# Patient Record
Sex: Female | Born: 1995 | Race: Black or African American | Hispanic: No | Marital: Single | Smoking: Never smoker
Health system: Southern US, Community
[De-identification: ages and names within clinical notes are randomized; demographics above are authoritative.]

## PROBLEM LIST (undated history)

## (undated) DIAGNOSIS — J45909 Unspecified asthma, uncomplicated: Secondary | ICD-10-CM

## (undated) HISTORY — PX: RETAINED PLACENTA REMOVAL: SHX2336

---
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
## 2019-03-26 ENCOUNTER — Encounter (HOSPITAL_BASED_OUTPATIENT_CLINIC_OR_DEPARTMENT_OTHER): Payer: Self-pay

## 2019-03-26 ENCOUNTER — Emergency Department (HOSPITAL_BASED_OUTPATIENT_CLINIC_OR_DEPARTMENT_OTHER): Payer: Medicaid Other

## 2019-03-26 ENCOUNTER — Other Ambulatory Visit: Payer: Self-pay

## 2019-03-26 ENCOUNTER — Emergency Department (HOSPITAL_BASED_OUTPATIENT_CLINIC_OR_DEPARTMENT_OTHER)
Admission: EM | Admit: 2019-03-26 | Discharge: 2019-03-26 | Disposition: A | Discharge status: Home / Self Care | Payer: Medicaid Other | Attending: Emergency Medicine | Admitting: Emergency Medicine

## 2019-03-26 DIAGNOSIS — R112 Nausea with vomiting, unspecified: Secondary | ICD-10-CM | POA: Diagnosis present

## 2019-03-26 DIAGNOSIS — Z3201 Encounter for pregnancy test, result positive: Secondary | ICD-10-CM | POA: Diagnosis not present

## 2019-03-26 DIAGNOSIS — R1030 Lower abdominal pain, unspecified: Secondary | ICD-10-CM | POA: Diagnosis not present

## 2019-03-26 DIAGNOSIS — J45909 Unspecified asthma, uncomplicated: Secondary | ICD-10-CM | POA: Diagnosis not present

## 2019-03-26 LAB — URINALYSIS, ROUTINE W REFLEX MICROSCOPIC
Bilirubin Urine: NEGATIVE
Bilirubin Urine: NEGATIVE
Glucose, UA: NEGATIVE mg/dL
Glucose, UA: NEGATIVE mg/dL
Hgb urine dipstick: NEGATIVE
Hgb urine dipstick: NEGATIVE
Ketones, ur: NEGATIVE mg/dL
Ketones, ur: 15 mg/dL — AB
Leukocytes,Ua: NEGATIVE
Nitrite: NEGATIVE
Nitrite: NEGATIVE
Protein, ur: NEGATIVE mg/dL
Protein, ur: NEGATIVE mg/dL
Specific Gravity, Urine: 1.02 (ref 1.005–1.030)
Specific Gravity, Urine: 1.02 (ref 1.005–1.030)
pH: 8 (ref 5.0–8.0)
pH: 7.5 (ref 5.0–8.0)

## 2019-03-26 LAB — URINALYSIS, MICROSCOPIC (REFLEX)

## 2019-03-26 LAB — HCG, QUANTITATIVE, PREGNANCY: hCG, Beta Chain, Quant, S: 401 m[IU]/mL — ABNORMAL HIGH (ref <5)

## 2019-03-26 LAB — PREGNANCY, URINE: Preg Test, Ur: POSITIVE — AB

## 2019-03-26 MED ORDER — PRENATAL COMPLETE 14-0.4 MG PO TABS: 1.0000 | ORAL_TABLET | Freq: Every day | ORAL | 0 refills | Status: DC

## 2019-03-26 NOTE — ED Provider Notes (Signed)
[facility] EMERGENCY DEPARTMENT Provider Note   CSN: [id number] Arrival date & time: 03/26/19  1251    History   Chief Complaint Chief Complaint  Patient presents with   Emesis    HPI Jamie Price is a 23 y.o. female.     The history is provided by the patient and medical records. No language interpreter was used.  Emesis Associated symptoms: abdominal pain   Associated symptoms: no diarrhea    Jamie Price is a 23 y.o. female who presents to the Emergency Department complaining of nausea and vomiting for the last 2 to 3 days.  Patient reports nausea with a couple episodes of emesis mostly in the morning.  As the day goes on, she starts to feel improved.  She did have 3 episodes of emesis today, but since then has been able to eat lunch well and tolerate this fine.  She currently is not having any abdominal pain, but has had intermittent lower abdominal cramping off and on for the last 2 to 3 days.  She denies any vaginal bleeding.  No vaginal discharge.  No urinary urgency, frequency or dysuria.  Her last menstrual cycle was May 9.  She did not take a pregnancy test at home, but does report expecting pregnancy and came to the emergency department hoping to get a pregnancy test.  She does report having a second trimester miscarriage in 2018.  She states that she did not have any symptoms at that time and was told about the fetal demise at a routine appointment.  Subsequently went under induction / D&C.  She states that because of this complication in the past, she just wanted to come to the hospital to make sure everything was okay if she was pregnant.  Past Medical History:  Diagnosis Date   Asthma     There are no active problems to display for this patient.   Past Surgical History:  Procedure Laterality Date   RETAINED PLACENTA REMOVAL       OB History   No obstetric history on file.      Home Medications    Prior to Admission medications     Medication Sig Start Date End Date Taking? Authorizing Provider  hydrocortisone cream 1 % Apply to affected area 2 times daily 04/25/13   Renne CriglerGeiple, Joshua, PA-C  Prenatal Vit-Fe Fumarate-FA (PRENATAL COMPLETE) 14-0.4 MG TABS Take 1 tablet by mouth daily. 03/26/19   Jacqueline Delapena, Chase PicketJaime Pilcher, PA-C    Family History No family history on file.  Social History Social History   Tobacco Use   Smoking status: Never Smoker   Smokeless tobacco: Never Used  Substance Use Topics   Alcohol use: No   Drug use: No     Allergies   Patient has no known allergies.   Review of Systems Review of Systems  Gastrointestinal: Positive for abdominal pain, nausea and vomiting. Negative for constipation and diarrhea.  Genitourinary: Negative for dysuria.  All other systems reviewed and are negative.    Physical Exam Updated Vital Signs BP 122/87 (BP Location: Right Arm)    Pulse 96    Temp 98.3 F (36.8 C) (Oral)    Resp 14    Ht 5\' 1"  (1.549 m)    Wt (!) 158.6 kg    LMP 02/14/2019    SpO2 100%    BMI 66.07 kg/m   Physical Exam Vitals signs and nursing note reviewed.  Constitutional:      General: She is not in  acute distress.    Appearance: She is well-developed.  HENT:     Head: Normocephalic and atraumatic.  Neck:     Musculoskeletal: Neck supple.  Cardiovascular:     Rate and Rhythm: Normal rate and regular rhythm.     Heart sounds: Normal heart sounds. No murmur.  Pulmonary:     Effort: Pulmonary effort is normal. No respiratory distress.     Breath sounds: Normal breath sounds.  Abdominal:     General: There is no distension.     Palpations: Abdomen is soft.     Comments: No abdominal, flank or CVA tenderness.  Skin:    General: Skin is warm and dry.  Neurological:     Mental Status: She is alert and oriented to person, place, and time.      ED Treatments / Results  Labs (all labs ordered are listed, but only abnormal results are displayed) Labs Reviewed  URINALYSIS,  ROUTINE W REFLEX MICROSCOPIC - Abnormal; Notable for the following components:      Result Value   APPearance CLOUDY (*)    Leukocytes,Ua TRACE (*)    All other components within normal limits  PREGNANCY, URINE - Abnormal; Notable for the following components:   Preg Test, Ur POSITIVE (*)    All other components within normal limits  URINALYSIS, MICROSCOPIC (REFLEX) - Abnormal; Notable for the following components:   Bacteria, UA MANY (*)    All other components within normal limits  HCG, QUANTITATIVE, PREGNANCY - Abnormal; Notable for the following components:   hCG, Beta Chain, Quant, S 401 (*)    All other components within normal limits  URINALYSIS, ROUTINE W REFLEX MICROSCOPIC - Abnormal; Notable for the following components:   Ketones, ur 15 (*)    All other components within normal limits    EKG None  Radiology [place] Ob Comp < 14 Wks  Result Date: 03/26/2019 CLINICAL DATA:  Pregnant patient with abdominal cramping. EXAM: OBSTETRIC <14 WK US AND TRANSVAGINAL OB US TECHNIQUE: Both transabdominal and transvaginal ultrasound examinations were performed for complete evaluation of the gestation as well as the maternal uterus, adnexal regions, and pelvic cul-de-sac. Transvaginal technique was performed to assess early pregnancy. COMPARISON:  None. FINDINGS: Intrauterine gestational sac: Single Yolk sac:  Not Visualized. Embryo:  Not Visualized. Cardiac Activity: Not Visualized. MSD: 2.6 mm   5 w   0 d Subchorionic hemorrhage:  None visualized. Maternal uterus/adnexae: Normal right and left ovaries. Trace fluid in the pelvis. IMPRESSION: Probable early intrauterine gestational sac, but no yolk sac, fetal pole, or cardiac activity yet visualized. Recommend follow-up quantitative B-HCG levels and follow-up US in 14 days to assess viability. This recommendation follows SRU consensus guidelines: Diagnostic Criteria for Nonviable Pregnancy Early in the First Trimester. Malva Limes Engl J Med 2013; 295:6213-08; 369:1443-51.  Electronically Signed   By: Annia Beltrew  Davis M.D.   On: 03/26/2019 16:06   [place] Ob Transvaginal  Result Date: 03/26/2019 CLINICAL DATA:  Pregnant patient with abdominal cramping. EXAM: OBSTETRIC <14 WK US AND TRANSVAGINAL OB US TECHNIQUE: Both transabdominal and transvaginal ultrasound examinations were performed for complete evaluation of the gestation as well as the maternal uterus, adnexal regions, and pelvic cul-de-sac. Transvaginal technique was performed to assess early pregnancy. COMPARISON:  None. FINDINGS: Intrauterine gestational sac: Single Yolk sac:  Not Visualized. Embryo:  Not Visualized. Cardiac Activity: Not Visualized. MSD: 2.6 mm   5 w   0 d Subchorionic hemorrhage:  None visualized. Maternal uterus/adnexae: Normal right and left ovaries. Trace fluid  in the pelvis. IMPRESSION: Probable early intrauterine gestational sac, but no yolk sac, fetal pole, or cardiac activity yet visualized. Recommend follow-up quantitative B-HCG levels and follow-up US in 14 days to assess viability. This recommendation follows SRU consensus guidelines: Diagnostic Criteria for Nonviable Pregnancy Early in the First Trimester. Alta Corning Med 2013; 297:9892-11. Electronically Signed   By: Lovey Newcomer M.D.   On: 03/26/2019 16:06    Procedures Procedures (including critical care time)  Medications Ordered in ED Medications - No data to display   Initial Impression / Assessment and Plan / ED Course  I have reviewed the triage vital signs and the nursing notes.  Pertinent labs & imaging results that were available during my care of the patient were reviewed by me and considered in my medical decision making (see chart for details).       Jamie Price is a 23 y.o. female who presents to ED for concerns that she may be pregnant.  She has been having nausea, vomiting and lower abdominal cramping mostly in the morning.  Currently, she denies any symptoms feels well.  On exam, she is well-appearing, afebrile,  hemodynamically stable with no abdominal tenderness.  Appears well-hydrated.  Urine pregnancy test positive.  hCG only 401.  Patient reports history of second trimester loss in 2018 and concern for complications.  Ultrasound to be obtained, however given such low hCG, expect do not see much and will likely need OB follow-up for serial hCG/repeat ultrasound.  First urinalysis looked like a dirty sample, but did have trace leuks and any bacteria.  Discussed clean catch with patient.  Repeat urinalysis showed no signs of infection.  She is not having any urinary symptoms.  No treatment indicated at this time.  Ultrasound shows probable early intrauterine gestational sac, but no yolk sac, fetal pole or cardiac activity yet visualized.  Expected given LMP and hCG value.  Recommendations for follow-up ultrasound/hCG levels by radiology noted.  We will have her follow-up with her OB/GYN.  She will call them in the morning.  She has not had any nausea or vomiting throughout the entirety of ED stay today. Started her on prenatal.  Reasons to return to the emergency department were discussed and all questions were answered.  Final Clinical Impressions(s) / ED Diagnoses   Final diagnoses:  Positive pregnancy test    ED Discharge Orders         Ordered    Prenatal Vit-Fe Fumarate-FA (PRENATAL COMPLETE) 14-0.4 MG TABS  Daily     03/26/19 1653           Catina Nuss, Ozella Almond, PA-C 03/26/19 1714    Quintella Reichert, MD 03/27/19 [phone]

## 2019-03-26 NOTE — Discharge Instructions (Signed)
It was my pleasure taking care of you today!   Please call your OBGYN tomorrow morning to schedule a follow up appointment for recheck. You will likely need repeat blood work and ultrasound in a few days.   Start taking prenatal vitamins daily.   Return to ER for new or worsening symptoms, any additional concerns.

## 2019-03-26 NOTE — ED Triage Notes (Signed)
Pt c/o n/v, abd cramping x 2-3 days-NAD-steady gait

## 2019-05-05 ENCOUNTER — Telehealth: Payer: Self-pay | Admitting: Family Medicine

## 2019-05-05 ENCOUNTER — Other Ambulatory Visit: Payer: Self-pay

## 2019-05-05 ENCOUNTER — Ambulatory Visit: Payer: Medicaid Other | Admitting: *Deleted

## 2019-05-05 ENCOUNTER — Encounter: Payer: Self-pay | Admitting: Family Medicine

## 2019-05-05 DIAGNOSIS — Z3490 Encounter for supervision of normal pregnancy, unspecified, unspecified trimester: Secondary | ICD-10-CM

## 2019-05-05 NOTE — Progress Notes (Signed)
I called  Jamie Price on 05/05/19 at 09:26AM EDT for the purpose of scheduled appointment to complete [facility] intake. She did not answer the phone. I left a message that I would call back in 10 minutes.   0940  Pt was called a second time and again did not answer. Message was left stating that this interview must take place prior to her first office appointment. Her interview appointment will be rescheduled and she will be notified of the date and time. She may call back if she has questions.     Day, Ronnell Freshwater, RN 05/05/2019  9:29 AM

## 2019-05-05 NOTE — Telephone Encounter (Signed)
Attempted to call patient to get her rescheduled for her missed intake appointment. No answer, left voicemail instructing patient to give the office a call back to be rescheduled. Patient instructed that her appointment on 8/12 has been cancelled as well since the intake appointment was not kept. Office number left and no show letter mailed.

## 2019-05-20 ENCOUNTER — Encounter: Payer: Medicaid Other | Admitting: Obstetrics & Gynecology

## 2019-06-01 ENCOUNTER — Encounter (HOSPITAL_BASED_OUTPATIENT_CLINIC_OR_DEPARTMENT_OTHER): Payer: Self-pay | Admitting: *Deleted

## 2019-06-01 ENCOUNTER — Emergency Department (HOSPITAL_BASED_OUTPATIENT_CLINIC_OR_DEPARTMENT_OTHER)
Admission: EM | Admit: 2019-06-01 | Discharge: 2019-06-01 | Disposition: A | Discharge status: Home / Self Care | Payer: Medicaid Other | Attending: Emergency Medicine | Admitting: Emergency Medicine

## 2019-06-01 ENCOUNTER — Other Ambulatory Visit: Payer: Self-pay

## 2019-06-01 DIAGNOSIS — Z79899 Other long term (current) drug therapy: Secondary | ICD-10-CM | POA: Diagnosis not present

## 2019-06-01 DIAGNOSIS — J45909 Unspecified asthma, uncomplicated: Secondary | ICD-10-CM | POA: Insufficient documentation

## 2019-06-01 DIAGNOSIS — K117 Disturbances of salivary secretion: Secondary | ICD-10-CM | POA: Insufficient documentation

## 2019-06-01 MED ORDER — METOCLOPRAMIDE HCL 10 MG PO TABS
10.0000 mg | ORAL_TABLET | Freq: Once | ORAL | Status: AC
  Administered 2019-06-01: 10 mg via ORAL
  Filled 2019-06-01: qty 1

## 2019-06-01 MED ORDER — FAMOTIDINE 20 MG PO TABS: 20.0000 mg | ORAL_TABLET | Freq: Two times a day (BID) | ORAL | 0 refills | Status: DC

## 2019-06-01 MED ORDER — FAMOTIDINE 20 MG PO TABS
20.0000 mg | ORAL_TABLET | Freq: Once | ORAL | Status: AC
  Administered 2019-06-01: 19:00:00 20 mg via ORAL
  Filled 2019-06-01: qty 1

## 2019-06-01 MED ORDER — FAMOTIDINE 20 MG PO TABS
ORAL_TABLET | ORAL | Status: AC
  Filled 2019-06-01: qty 1

## 2019-06-01 MED ORDER — METOCLOPRAMIDE HCL 10 MG PO TABS: 10.0000 mg | ORAL_TABLET | Freq: Four times a day (QID) | ORAL | 0 refills | Status: DC | PRN

## 2019-06-01 NOTE — ED Triage Notes (Signed)
Excessive saliva. She is [redacted] weeks pregnant. She is taking medication for nausea that works sometimes.

## 2019-06-01 NOTE — ED Provider Notes (Signed)
[facility] EMERGENCY DEPARTMENT Provider Note   CSN: [id number] Arrival date & time: 06/01/19  1642     History   Chief Complaint Chief Complaint  Patient presents with  . Excessive Saliva    HPI Jamie Price is a 23 y.o. female.     HPI Patient reports that she is pregnant at [redacted] weeks.  She denies she is having any pelvic pain, vaginal discharge or bleeding.  Her symptom is excessive saliva production and spitting.  She reports this is been going on for couple weeks.  She reports she gets nauseated very easily.  She reports that this happened once before during pregnancy and she ended up having a miscarriage so she is worried now about having a possible miscarriage.  She reports that her doctors tried treating her with likely just but is not working.  She reports that they have told her that there is really nothing they can do about this symptom.  She does not have other symptoms of fever, cough, shortness of breath.  She is not actively vomiting.  She reports she is tried mints and chewing gum but nothing is helping. Past Medical History:  Diagnosis Date  . Asthma     There are no active problems to display for this patient.   Past Surgical History:  Procedure Laterality Date  . RETAINED PLACENTA REMOVAL       OB History    Gravida  1   Para      Term      Preterm      AB      Living        SAB      TAB      Ectopic      Multiple      Live Births               Home Medications    Prior to Admission medications   Medication Sig Start Date End Date Taking? Authorizing Provider  hydrocortisone cream 1 % Apply to affected area 2 times daily 04/25/13  Yes Renne CriglerGeiple, Joshua, PA-C  Prenatal Vit-Fe Fumarate-FA (PRENATAL COMPLETE) 14-0.4 MG TABS Take 1 tablet by mouth daily. 03/26/19  Yes Ward, Chase PicketJaime Pilcher, PA-C  famotidine (PEPCID) 20 MG tablet Take 1 tablet (20 mg total) by mouth 2 (two) times daily. 06/01/19   Arby BarrettePfeiffer, Hakim Minniefield, MD   metoCLOPramide (REGLAN) 10 MG tablet Take 1 tablet (10 mg total) by mouth every 6 (six) hours as needed for nausea (nausea/headache). 06/01/19   Arby BarrettePfeiffer, Krishon Adkison, MD    Family History No family history on file.  Social History Social History   Tobacco Use  . Smoking status: Never Smoker  . Smokeless tobacco: Never Used  Substance Use Topics  . Alcohol use: No  . Drug use: No     Allergies   Patient has no known allergies.   Review of Systems Review of Systems 10 Systems reviewed and are negative for acute change except as noted in the HPI.   Physical Exam Updated Vital Signs BP (!) 142/75 (BP Location: Left Arm)   Pulse (!) 110   Temp 98.8 F (37.1 C) (Oral)   Resp 14   Ht 5\' 3"  (1.6 m)   Wt (!) 152.1 kg   SpO2 100%   BMI 59.40 kg/m   Physical Exam Constitutional:      Comments: Patient is alert and nontoxic.  She is clinically well in appearance.  No respiratory distress.  HENT:  Head: Normocephalic and atraumatic.     Mouth/Throat:     Mouth: Mucous membranes are moist.     Pharynx: Oropharynx is clear.     Comments: Oral cavity is normal in appearance.  Mucous membranes are pink and moist.  Posterior oropharynx widely patent.  Speech and voice are clear. Eyes:     Extraocular Movements: Extraocular movements intact.     Conjunctiva/sclera: Conjunctivae normal.  Cardiovascular:     Rate and Rhythm: Normal rate and regular rhythm.  Pulmonary:     Effort: Pulmonary effort is normal.     Breath sounds: Normal breath sounds.  Abdominal:     General: There is no distension.     Palpations: Abdomen is soft.     Tenderness: There is no abdominal tenderness. There is no guarding.  Musculoskeletal: Normal range of motion.        General: No swelling or tenderness.     Right lower leg: No edema.     Left lower leg: No edema.  Skin:    General: Skin is warm and dry.  Neurological:     General: No focal deficit present.     Mental Status: She is oriented  to person, place, and time.     Coordination: Coordination normal.      ED Treatments / Results  Labs (all labs ordered are listed, but only abnormal results are displayed) Labs Reviewed - No data to display  EKG None  Radiology No results found.  Procedures Procedures (including critical care time)  Medications Ordered in ED Medications  metoCLOPramide (REGLAN) tablet 10 mg (10 mg Oral Given 06/01/19 1853)  famotidine (PEPCID) tablet 20 mg (20 mg Oral Given 06/01/19 1853)     Initial Impression / Assessment and Plan / ED Course  I have reviewed the triage vital signs and the nursing notes.  Pertinent labs & imaging results that were available during my care of the patient were reviewed by me and considered in my medical decision making (see chart for details).        Patient clinically well in appearance.  She does not have any complaints of vaginal bleeding or abdominal pain.  She is having hypersalivation but no other symptoms to explain this.  She does not appear dehydrated.  The oral cavity is normal.  She does describe some reflux symptoms.  I will try Pepcid twice daily and Reglan to try to help with this.  This may be nausea related.  She is however not having any active vomiting.  Patient has concern for possible miscarriage in association with this symptom.  At this time I have no reason to suspect miscarriage based on hypersalivation.  Patient is counseled to follow-up with her OB/GYN for continued monitoring of her pregnancy.  She is anxious and upset but at this time I do not identify other symptoms for further diagnostic evaluation or treatment at this time in the emergency department.  Final Clinical Impressions(s) / ED Diagnoses   Final diagnoses:  Hypersalivation    ED Discharge Orders         Ordered    metoCLOPramide (REGLAN) 10 MG tablet  Every 6 hours PRN     06/01/19 1943    famotidine (PEPCID) 20 MG tablet  2 times daily     06/01/19 1943            Charlesetta Shanks, MD 06/01/19 [phone]

## 2019-06-01 NOTE — Discharge Instructions (Signed)
1.  Follow-up with your obstetrician this week for recheck. 2.  Try taking Reglan shortly before meals.  Take Pepcid twice daily.

## 2019-06-01 NOTE — ED Notes (Signed)
Entered room to discharge patient. Pt was nowhere to be found.

## 2019-06-01 NOTE — ED Notes (Signed)
Attempted to find [facility] but was unable.

## 2021-12-27 ENCOUNTER — Encounter (HOSPITAL_BASED_OUTPATIENT_CLINIC_OR_DEPARTMENT_OTHER): Payer: Self-pay | Admitting: Emergency Medicine

## 2021-12-27 ENCOUNTER — Other Ambulatory Visit (HOSPITAL_BASED_OUTPATIENT_CLINIC_OR_DEPARTMENT_OTHER): Payer: Self-pay

## 2021-12-27 ENCOUNTER — Emergency Department (HOSPITAL_BASED_OUTPATIENT_CLINIC_OR_DEPARTMENT_OTHER)
Admission: EM | Admit: 2021-12-27 | Discharge: 2021-12-27 | Disposition: A | Discharge status: Home / Self Care | Payer: Medicaid Other | Attending: Emergency Medicine | Admitting: Emergency Medicine

## 2021-12-27 ENCOUNTER — Emergency Department (HOSPITAL_BASED_OUTPATIENT_CLINIC_OR_DEPARTMENT_OTHER): Payer: Medicaid Other

## 2021-12-27 ENCOUNTER — Other Ambulatory Visit: Payer: Self-pay

## 2021-12-27 DIAGNOSIS — J45909 Unspecified asthma, uncomplicated: Secondary | ICD-10-CM | POA: Insufficient documentation

## 2021-12-27 DIAGNOSIS — S3992XA Unspecified injury of lower back, initial encounter: Secondary | ICD-10-CM | POA: Diagnosis present

## 2021-12-27 DIAGNOSIS — S300XXA Contusion of lower back and pelvis, initial encounter: Secondary | ICD-10-CM

## 2021-12-27 MED ORDER — IBUPROFEN 800 MG PO TABS
800.0000 mg | ORAL_TABLET | Freq: Once | ORAL | Status: AC
  Administered 2021-12-27: 800 mg via ORAL
  Filled 2021-12-27: qty 1

## 2021-12-27 MED ORDER — HYDROCODONE-ACETAMINOPHEN 5-325 MG PO TABS
1.0000 | ORAL_TABLET | Freq: Four times a day (QID) | ORAL | 0 refills | Status: DC | PRN
  Filled 2021-12-27: qty 14, 4d supply, fill #0

## 2021-12-27 NOTE — ED Triage Notes (Signed)
Pt reports pain in bilateral upper buttocks. Pain worse with movement and when sitting. Went horseback riding and ATV riding over the weekend, but no known injury. ?

## 2021-12-27 NOTE — Discharge Instructions (Signed)
Take the pain medication hydrocodone as directed.  Continue to take Motrin.  As discussed this could possibly be an early infection so if things get worse that would be consistent with that.  If is just soft tissue trauma then things should resolve slowly over the next few days. ?

## 2021-12-27 NOTE — ED Provider Notes (Signed)
?[facility] EMERGENCY DEPARTMENT ?Provider Note ? ? ?CSN: [id number] ?Arrival date & time: 12/27/21  5686 ? ?  ? ?History ? ?Chief Complaint  ?Patient presents with  ? Tailbone Pain  ? ? ?Jamie Price is a 26 y.o. female. ? ?Patient was doing horseback riding an ATV riding over the weekend no known injury.  But today has bilateral upper buttocks pain.  Pain worse with movement when sitting.  No history of any abscesses in the area.  No fever.  Past medical history significant for asthma.  Patient without any incontinence.  No pain radiation into the lower extremities. ? ? ?  ? ?Home Medications ?Prior to Admission medications   ?Medication Sig Start Date End Date Taking? Authorizing Provider  ?famotidine (PEPCID) 20 MG tablet Take 1 tablet (20 mg total) by mouth 2 (two) times daily. 06/01/19   Arby Barrette, MD  ?hydrocortisone cream 1 % Apply to affected area 2 times daily 04/25/13   Renne Crigler, PA-C  ?metoCLOPramide (REGLAN) 10 MG tablet Take 1 tablet (10 mg total) by mouth every 6 (six) hours as needed for nausea (nausea/headache). 06/01/19   Arby Barrette, MD  ?Prenatal Vit-Fe Fumarate-FA (PRENATAL COMPLETE) 14-0.4 MG TABS Take 1 tablet by mouth daily. 03/26/19   Ward, Chase Picket, PA-C  ?   ? ?Allergies    ?Patient has no known allergies.   ? ?Review of Systems   ?Review of Systems  ?Constitutional:  Negative for chills and fever.  ?HENT:  Negative for ear pain and sore throat.   ?Eyes:  Negative for pain and visual disturbance.  ?Respiratory:  Negative for cough and shortness of breath.   ?Cardiovascular:  Negative for chest pain and palpitations.  ?Gastrointestinal:  Negative for abdominal pain and vomiting.  ?Genitourinary:  Negative for dysuria and hematuria.  ?Musculoskeletal:  Positive for back pain. Negative for arthralgias.  ?Skin:  Negative for color change and rash.  ?Neurological:  Negative for seizures and syncope.  ?All other systems reviewed and are negative. ? ?Physical  Exam ?Updated Vital Signs ?BP 99/63   Pulse 90   Temp 98.3 ?F (36.8 ?C) (Oral)   Resp 16   SpO2 100%  ?Physical Exam ?Vitals and nursing note reviewed.  ?Constitutional:   ?   General: She is not in acute distress. ?   Appearance: Normal appearance. She is well-developed. She is obese.  ?HENT:  ?   Head: Normocephalic and atraumatic.  ?Eyes:  ?   Extraocular Movements: Extraocular movements intact.  ?   Conjunctiva/sclera: Conjunctivae normal.  ?   Pupils: Pupils are equal, round, and reactive to light.  ?Cardiovascular:  ?   Rate and Rhythm: Normal rate and regular rhythm.  ?   Heart sounds: No murmur heard. ?Pulmonary:  ?   Effort: Pulmonary effort is normal. No respiratory distress.  ?   Breath sounds: Normal breath sounds.  ?Abdominal:  ?   Palpations: Abdomen is soft.  ?   Tenderness: There is no abdominal tenderness.  ?   Comments: Very obese.  But no abdominal tenderness.  ?Genitourinary: ?   Comments: Patient very obese so some limitations on exam.  There is an area in the sacral coccyx area where there is bilateral erythema.'s some questionable induration no fluctuance.  No evidence of any distinct abscess cavity.  Nothing immediately around the perianal area. ?Musculoskeletal:     ?   General: No swelling.  ?   Cervical back: Normal range of motion and neck supple.  ?  Comments: No tenderness to palpation to the lumbar spine area.  ?Skin: ?   General: Skin is warm and dry.  ?   Capillary Refill: Capillary refill takes less than 2 seconds.  ?Neurological:  ?   General: No focal deficit present.  ?   Mental Status: She is alert and oriented to person, place, and time.  ?Psychiatric:     ?   Mood and Affect: Mood normal.  ? ? ?ED Results / Procedures / Treatments   ?Labs ?(all labs ordered are listed, but only abnormal results are displayed) ?Labs Reviewed  ?PREGNANCY, URINE  ? ? ?EKG ?None ? ?Radiology ?DG Pelvis 1-2 Views ? ?Result Date: 12/27/2021 ?CLINICAL DATA:  Pain in both upper buttock regions.  EXAM: PELVIS - 1-2 VIEW COMPARISON:  None. FINDINGS: There is no evidence of pelvic fracture or diastasis. No pelvic bone lesions are seen. IMPRESSION: Negative. Electronically Signed   By: Irish Lack M.D.   On: 12/27/2021 11:30  ? ?DG Sacrum/Coccyx ? ?Result Date: 12/27/2021 ?CLINICAL DATA:  Tailbone pain sacral pain EXAM: SACRUM AND COCCYX - 2+ VIEW COMPARISON:  None. FINDINGS: There is no evidence of fracture or other focal bone lesions. IMPRESSION: Negative. Electronically Signed   By: Lesia Hausen M.D.   On: 12/27/2021 11:32   ? ?Procedures ?Procedures  ? ? ?Medications Ordered in ED ?Medications  ?ibuprofen (ADVIL) tablet 800 mg (800 mg Oral Given 12/27/21 1104)  ? ? ?ED Course/ Medical Decision Making/ A&P ?  ?                        ?Medical Decision Making ?Amount and/or Complexity of Data Reviewed ?Labs: ordered. ?Radiology: ordered. ? ?Risk ?Prescription drug management. ? ? ?No evidence of any pilonidal or perianal abscess.  But patient is very obese.  The erythema in that area and the tenderness to palpation just could be secondary to horseback riding and the ATV riding yesterday. ? ?Certainly no evidence of any drainable abscess at this point in time. ? ?We will get pelvic x-ray and x-ray of the sacrum and coccyx.  If that is equivocal may need to do CT pelvis due to the patient's large size. ? ?Patient able to take Motrin.  We will treat here with Motrin for pain.  Patient drove herself here.  So she cannot have any narcotics here. ? ?X-ray of the pelvis x-ray of the sacrum and coccyx without any bony abnormalities.  We will treat symptomatically with hydrocodone and patient can take Motrin at home. ? ?As discussed with patient not able to tell whether this is an early abscess formation.  It would be most likely a pilonidal because there is some erythema and discomfort to both sides but again this could be just soft tissue irritation and trauma from the riding of the horse an ATV yesterday.   Patient will return for any new or worse symptoms.  Never had an infection in that area in the past. ? ? ?Final Clinical Impression(s) / ED Diagnoses ?Final diagnoses:  ?Contusion of buttock, initial encounter  ? ? ?Rx / DC Orders ?ED Discharge Orders   ? ? None  ? ?  ? ? ?  ?Vanetta Mulders, MD ?12/27/21 1206 ? ?

## 2022-12-07 IMAGING — CR DG SACRUM/COCCYX 2+V
3 series · 3 of 3 positions shown · non-contrast
Comparison: None.

CLINICAL DATA: Tailbone pain sacral pain

EXAM:
SACRUM AND COCCYX - 2+ VIEW

[t sacrum a.p.]
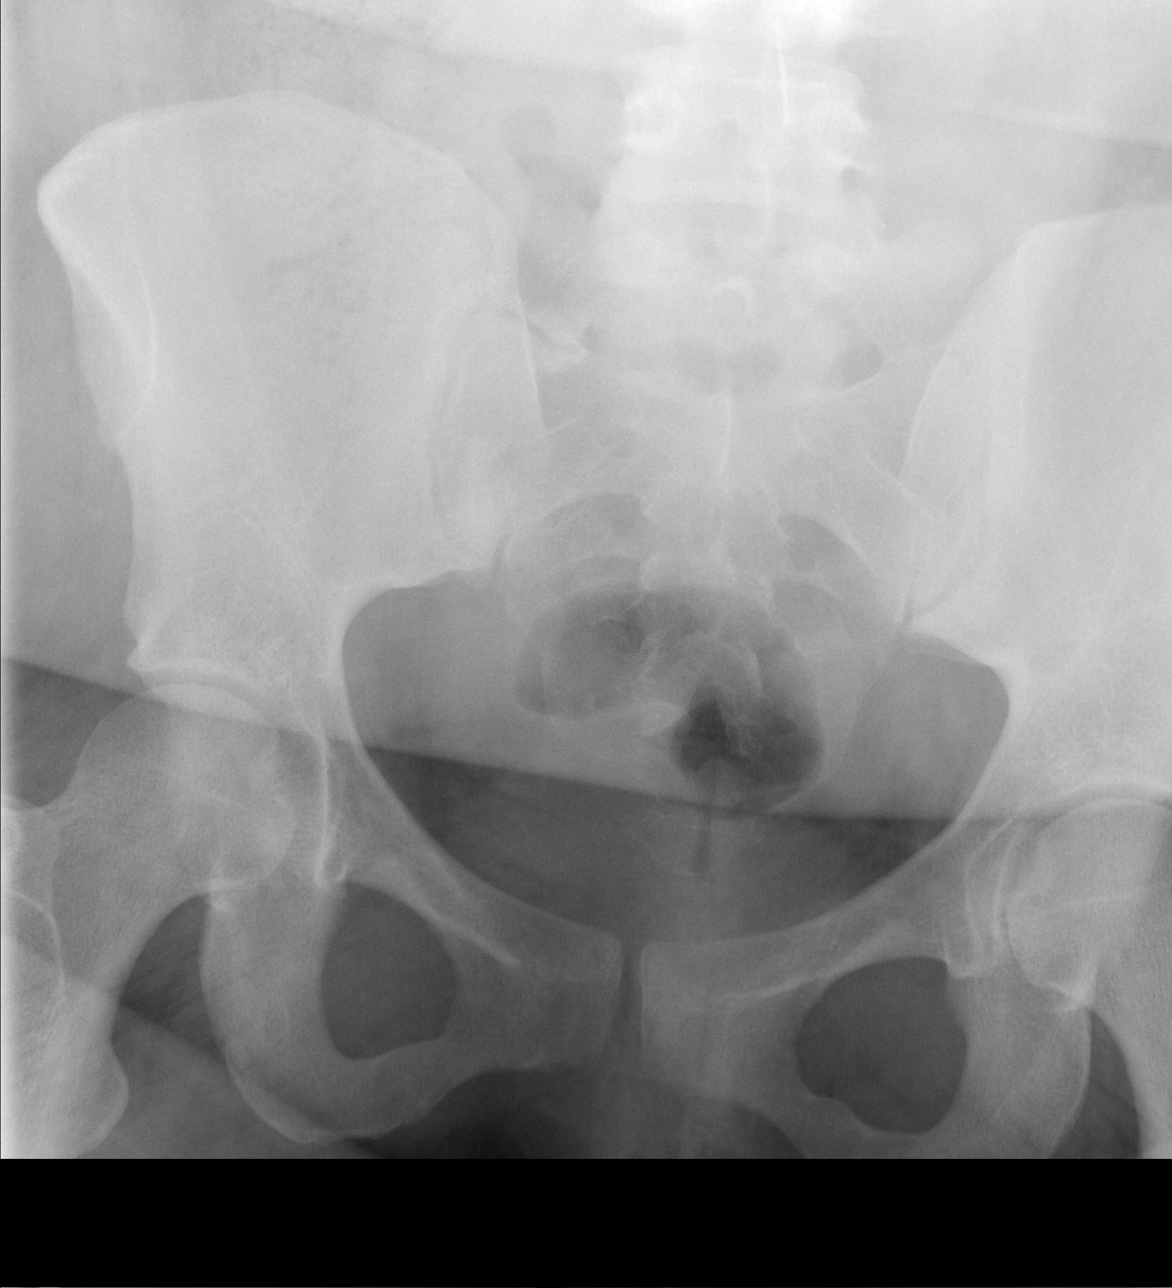

[t coccyx a.p. *]
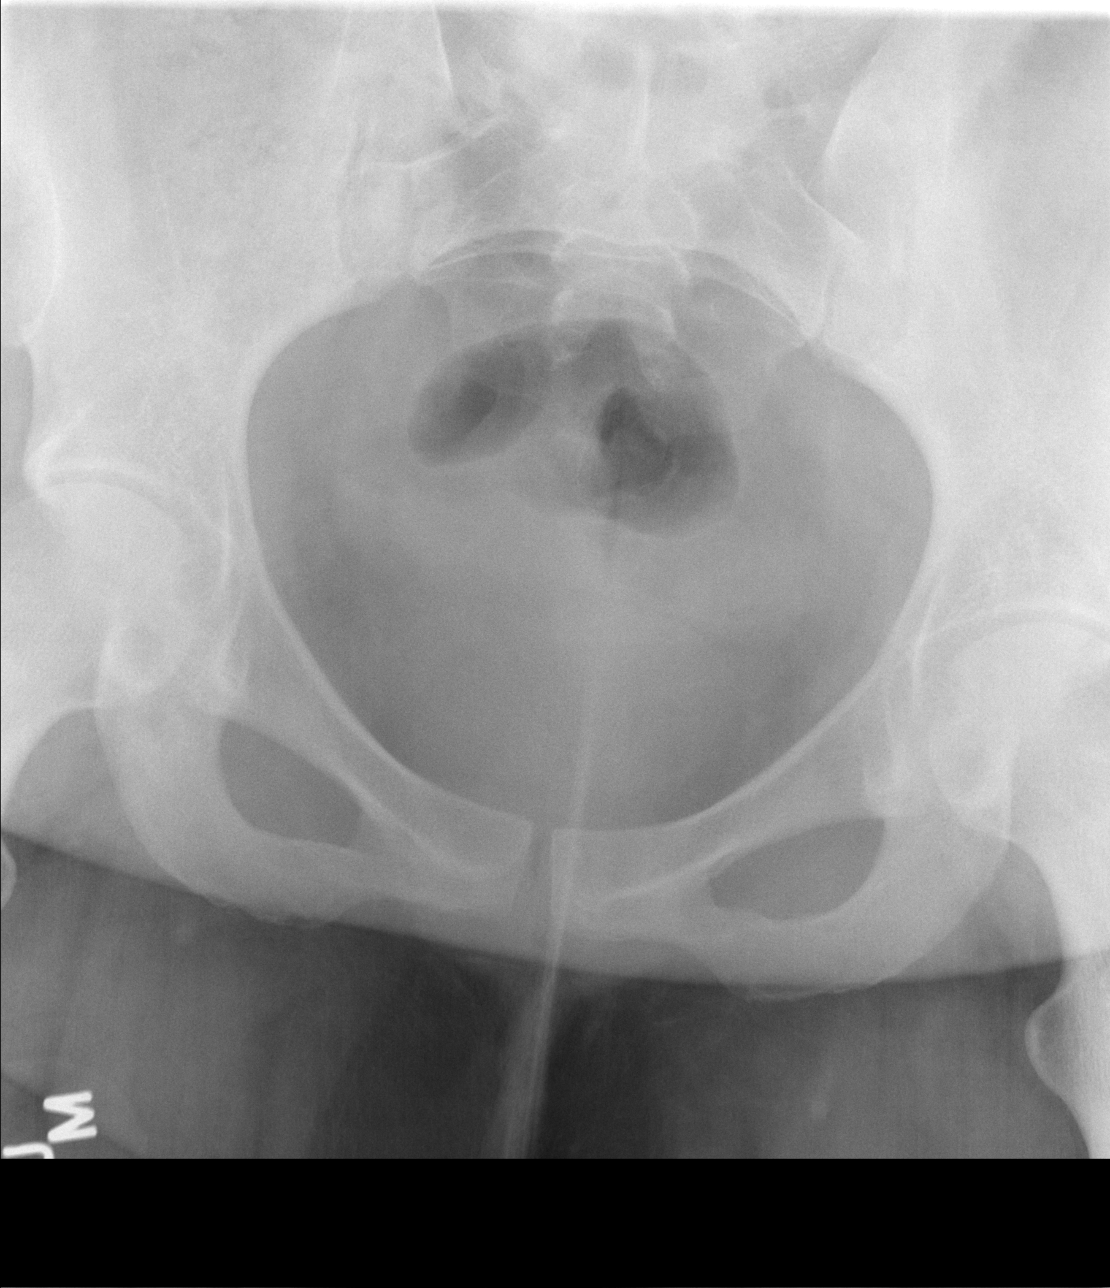

[t sacrum lat *]
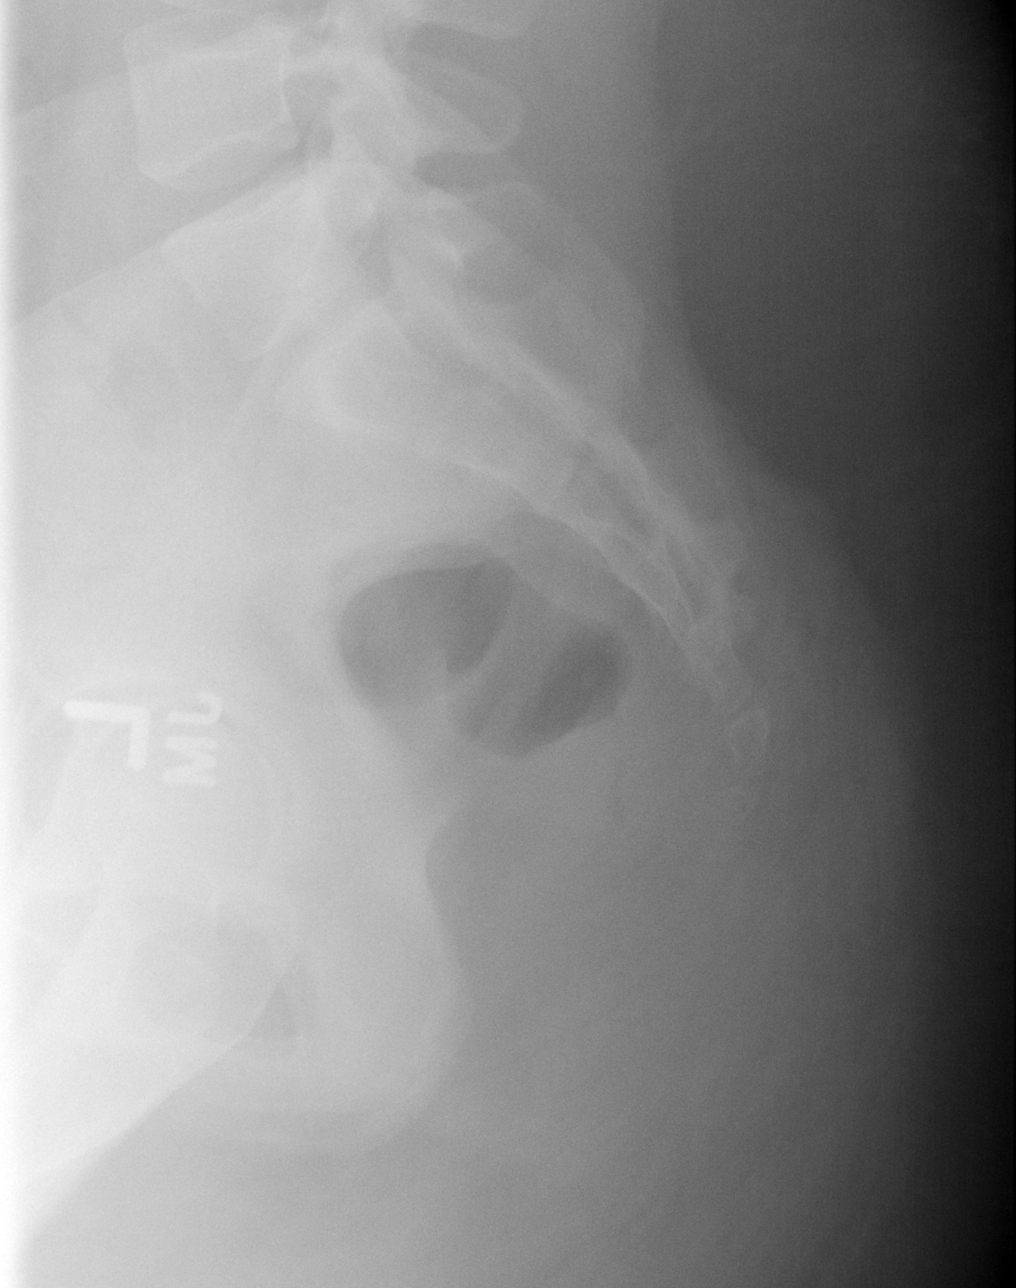

[3 of 3 positions shown; findings below may reference images not displayed]

FINDINGS: There is no evidence of fracture or other focal bone lesions.
IMPRESSION: Negative.

## 2023-01-09 ENCOUNTER — Other Ambulatory Visit: Payer: Self-pay

## 2023-01-09 ENCOUNTER — Emergency Department (HOSPITAL_BASED_OUTPATIENT_CLINIC_OR_DEPARTMENT_OTHER): Payer: Medicaid Other

## 2023-01-09 ENCOUNTER — Emergency Department (HOSPITAL_BASED_OUTPATIENT_CLINIC_OR_DEPARTMENT_OTHER)
Admission: EM | Admit: 2023-01-09 | Discharge: 2023-01-09 | Disposition: A | Discharge status: Home / Self Care | Payer: Medicaid Other | Attending: Emergency Medicine | Admitting: Emergency Medicine

## 2023-01-09 DIAGNOSIS — Z3A01 Less than 8 weeks gestation of pregnancy: Secondary | ICD-10-CM | POA: Diagnosis not present

## 2023-01-09 DIAGNOSIS — O4690 Antepartum hemorrhage, unspecified, unspecified trimester: Secondary | ICD-10-CM

## 2023-01-09 DIAGNOSIS — O4691 Antepartum hemorrhage, unspecified, first trimester: Secondary | ICD-10-CM | POA: Diagnosis not present

## 2023-01-09 LAB — CBC
HCT: 34.3 % — ABNORMAL LOW (ref 36.0–46.0)
Hemoglobin: 11.2 g/dL — ABNORMAL LOW (ref 12.0–15.0)
MCH: 29.2 pg (ref 26.0–34.0)
MCHC: 32.7 g/dL (ref 30.0–36.0)
MCV: 89.6 fL (ref 80.0–100.0)
Platelets: 263 10*3/uL (ref 150–400)
RBC: 3.83 MIL/uL — ABNORMAL LOW (ref 3.87–5.11)
RDW: 13.6 % (ref 11.5–15.5)
WBC: 10.2 10*3/uL (ref 4.0–10.5)
nRBC: 0 % (ref 0.0–0.2)

## 2023-01-09 LAB — COMPREHENSIVE METABOLIC PANEL
ALT: 15 U/L (ref 0–44)
AST: 14 U/L — ABNORMAL LOW (ref 15–41)
Albumin: 3.4 g/dL — ABNORMAL LOW (ref 3.5–5.0)
Alkaline Phosphatase: 66 U/L (ref 38–126)
Anion gap: 6 (ref 5–15)
BUN: 10 mg/dL (ref 6–20)
CO2: 24 mmol/L (ref 22–32)
Calcium: 8.3 mg/dL — ABNORMAL LOW (ref 8.9–10.3)
Chloride: 103 mmol/L (ref 98–111)
Creatinine, Ser: 0.59 mg/dL (ref 0.44–1.00)
GFR, Estimated: >60 mL/min (ref >60)
Glucose, Bld: 91 mg/dL (ref 70–99)
Potassium: 4 mmol/L (ref 3.5–5.1)
Sodium: 133 mmol/L — ABNORMAL LOW (ref 135–145)
Total Bilirubin: 0.4 mg/dL (ref 0.3–1.2)
Total Protein: 7.6 g/dL (ref 6.5–8.1)

## 2023-01-09 LAB — HCG, QUANTITATIVE, PREGNANCY: hCG, Beta Chain, Quant, S: 2987 m[IU]/mL — ABNORMAL HIGH (ref <5)

## 2023-01-09 LAB — PREGNANCY, URINE: Preg Test, Ur: POSITIVE — AB

## 2023-01-09 NOTE — ED Notes (Signed)
D/c paperwork reviewed with pt, including follow up care.  All questions and/or concerns addressed at time of d/c.  No further needs expressed. . Pt verbalized understanding, Ambulatory without assistance to ED exit, NAD.   

## 2023-01-09 NOTE — ED Provider Notes (Signed)
[facility] Provider Note   CSN: [id number] Arrival date & time: 01/09/23  1141     History  Chief Complaint  Patient presents with   Vaginal Bleeding    Jamie Price is a 27 y.o. female.  Patient with no pertinent past medical history presents today with complaints of vaginal bleeding. She states that same began yesterday evening and was heavy in nature but is now only spotting. She states that a few weeks ago she had a positive home pregnancy test and has scheduled an appointment with an OB/GYN for follow-up but her appointment to establish care is not until May.  Her last menstrual cycle was at the beginning of February. She is G3P1. Denies any abdominal pain, nausea, vomiting, diarrhea, urinary symptoms, or vaginal discharge.   The history is provided by the patient. No language interpreter was used.  Vaginal Bleeding      Home Medications Prior to Admission medications   Medication Sig Start Date End Date Taking? Authorizing Provider  famotidine (PEPCID) 20 MG tablet Take 1 tablet (20 mg total) by mouth 2 (two) times daily. 06/01/19   Charlesetta Shanks, MD  HYDROcodone-acetaminophen (NORCO/VICODIN) 5-325 MG tablet Take 1 tablet by mouth every 6 (six) hours as needed for moderate pain. 12/27/21   Fredia Sorrow, MD  hydrocortisone cream 1 % Apply to affected area 2 times daily 04/25/13   Carlisle Cater, PA-C  metoCLOPramide (REGLAN) 10 MG tablet Take 1 tablet (10 mg total) by mouth every 6 (six) hours as needed for nausea (nausea/headache). 06/01/19   Charlesetta Shanks, MD  Prenatal Vit-Fe Fumarate-FA (PRENATAL COMPLETE) 14-0.4 MG TABS Take 1 tablet by mouth daily. 03/26/19   Ward, Ozella Almond, PA-C      Allergies    Patient has no known allergies.    Review of Systems   Review of Systems  Genitourinary:  Positive for vaginal bleeding.  All other systems reviewed and are negative.   Physical Exam Updated Vital Signs BP  (!) 154/98   Pulse 97   Temp 97.8 F (36.6 C) (Oral)   Resp 17   SpO2 99%  Physical Exam Vitals and nursing note reviewed. Exam conducted with a chaperone present.  Constitutional:      General: She is not in acute distress.    Appearance: Normal appearance. She is normal weight. She is not ill-appearing, toxic-appearing or diaphoretic.  HENT:     Head: Normocephalic and atraumatic.  Cardiovascular:     Rate and Rhythm: Normal rate.  Pulmonary:     Effort: Pulmonary effort is normal. No respiratory distress.  Abdominal:     General: Abdomen is flat.     Palpations: Abdomen is soft.     Tenderness: There is no abdominal tenderness.  Genitourinary:    Vagina: Normal. No tenderness or bleeding.     Cervix: Normal.     Uterus: Normal.      Adnexa: Right adnexa normal and left adnexa normal.     Comments: Cervix closed Musculoskeletal:        General: Normal range of motion.     Cervical back: Normal range of motion.  Skin:    General: Skin is warm and dry.  Neurological:     General: No focal deficit present.     Mental Status: She is alert.  Psychiatric:        Mood and Affect: Mood normal.        Behavior: Behavior normal.  ED Results / Procedures / Treatments   Labs (all labs ordered are listed, but only abnormal results are displayed) Labs Reviewed  PREGNANCY, URINE - Abnormal; Notable for the following components:      Result Value   Preg Test, Ur POSITIVE (*)    All other components within normal limits  CBC - Abnormal; Notable for the following components:   RBC 3.83 (*)    Hemoglobin 11.2 (*)    HCT 34.3 (*)    All other components within normal limits  HCG, QUANTITATIVE, PREGNANCY - Abnormal; Notable for the following components:   hCG, Beta Chain, Quant, S 2,987 (*)    All other components within normal limits  COMPREHENSIVE METABOLIC PANEL - Abnormal; Notable for the following components:   Sodium 133 (*)    Calcium 8.3 (*)    Albumin 3.4 (*)     AST 14 (*)    All other components within normal limits  ABO/RH    EKG None  Radiology US OB LESS THAN 14 WEEKS WITH OB TRANSVAGINAL  Result Date: 01/09/2023 CLINICAL DATA:  Vaginal bleeding.  Unknown estimated gestational age 51: OBSTETRIC <14 WK [place] AND TRANSVAGINAL OB US TECHNIQUE: Both transabdominal and transvaginal ultrasound examinations were performed for complete evaluation of the gestation as well as the maternal uterus, adnexal regions, and pelvic cul-de-sac. Transvaginal technique was performed to assess early pregnancy. COMPARISON:  None Available. FINDINGS: Intrauterine gestational sac: Single Yolk sac:  Not identified Embryo:  Not identified MSD: 5.6 mm   5 w   2 d Subchorionic hemorrhage:  None visualized. Maternal uterus/adnexae: Normal ovaries.  No free fluid. IMPRESSION: Probable early intrauterine gestational sac, but no yolk sac, fetal pole, or cardiac activity yet visualized. Recommend follow-up quantitative B-HCG levels and follow-up US in 14 days to assess viability. This recommendation follows SRU consensus guidelines: Diagnostic Criteria for Nonviable Pregnancy Early in the First Trimester. [employer] Med [id number]. Electronically Signed   By: Suzy Bouchard M.D.   On: 01/09/2023 15:43    Procedures Procedures    Medications Ordered in ED Medications - No data to display  ED Course/ Medical Decision Making/ A&P                             Medical Decision Making Amount and/or Complexity of Data Reviewed Labs: ordered. Radiology: ordered.   This patient is a 27 y.o. female who presents to the ED for concern of vaginal bleeding in pregnancy, this involves an extensive number of treatment options, and is a complaint that carries with it a high risk of complications and morbidity. The emergent differential diagnosis prior to evaluation includes, but is not limited to,  Abnormal uterine bleeding, threatened miscarriage, incomplete miscarriage, normal  bleeding from an early trimester pregnancy, ectopic pregnancy, vaginal/cervical trauma, subchorionic hemorrhage/hematoma  This is not an exhaustive differential.   Past Medical History / Co-morbidities / Social History: G3P1  Additional history: Patient with type and screen in 2021 that shows patient is A positive  Physical Exam: Physical exam performed. The pertinent findings include: Abdomen soft and nontender, no vaginal bleeding present, cervix closed.  Lab Tests: I ordered, and personally interpreted labs.  The pertinent results include: urpeg positive, hgb 11.2, hcg 2,987.    Imaging Studies: I ordered imaging studies including Pelvic US. I independently visualized and interpreted imaging which showed   Probable early intrauterine gestational sac, but no yolk sac, fetal pole, or  cardiac activity yet visualized. Recommend follow-up quantitative B-HCG levels and follow-up US in 14 days to assess viability.   I agree with the radiologist interpretation.   Disposition:  Patient presents today with complaints of vaginal bleeding in pregnancy.  She is afebrile, nontoxic-appearing, and in no acute distress with reassuring vital signs.  Her abdomen is soft and nontender.  Laboratory evaluation and ultrasound imaging did not reveal any signs of ectopic pregnancy or hemorrhage. Hemoglobin is stable. Plan for discharge with OB/GYN follow-up in 2 weeks for hcg trending and repeat ultrasound. Evaluation and diagnostic testing in the emergency department does not suggest an emergent condition requiring admission or immediate intervention beyond what has been performed at this time.  Patient is understanding and amenable with plan, educated on red flag symptoms that would prompt immediate return.  Patient discharged in stable condition.  Final Clinical Impression(s) / ED Diagnoses Final diagnoses:  Vaginal bleeding in pregnancy  [redacted] weeks gestation of pregnancy    Rx / DC Orders ED Discharge  Orders     None     An After Visit Summary was printed and given to the patient.     Nestor Lewandowsky 01/09/23 1646    Margette Fast, MD 01/09/23 [phone]

## 2023-01-09 NOTE — ED Triage Notes (Signed)
Patient presents to ED via POV from home. Here with vaginal bleeding that began last night. Reports 2 weeks ago she had a positive pregnancy test. LMP was at the beginning of February. Reports bright red blood. Denies pain.

## 2023-01-09 NOTE — ED Notes (Signed)
Pt ambulatory from triage 

## 2023-01-09 NOTE — Discharge Instructions (Signed)
Hcg: 2,987  As we discussed, your workup in the ER today was reassuring for acute findings.  Ultrasound imaging did not reveal a fetal heartbeat, however this can be normal at this stage of pregnancy.  You will need to call your OB/GYN to schedule follow-up appointment in 2 weeks to have your hCG repeated and likely a repeat ultrasound as well.  Return if development of any new or worsening symptoms.

## 2023-01-10 ENCOUNTER — Ambulatory Visit: Payer: Medicaid Other

## 2023-01-10 LAB — ABO/RH: ABO/RH(D): A POS

## 2023-02-20 ENCOUNTER — Encounter (HOSPITAL_BASED_OUTPATIENT_CLINIC_OR_DEPARTMENT_OTHER): Payer: Self-pay | Admitting: Emergency Medicine

## 2023-02-20 ENCOUNTER — Other Ambulatory Visit: Payer: Self-pay

## 2023-02-20 DIAGNOSIS — Z3A11 11 weeks gestation of pregnancy: Secondary | ICD-10-CM | POA: Insufficient documentation

## 2023-02-20 DIAGNOSIS — J45909 Unspecified asthma, uncomplicated: Secondary | ICD-10-CM | POA: Diagnosis not present

## 2023-02-20 DIAGNOSIS — O99511 Diseases of the respiratory system complicating pregnancy, first trimester: Secondary | ICD-10-CM | POA: Insufficient documentation

## 2023-02-20 DIAGNOSIS — O2341 Unspecified infection of urinary tract in pregnancy, first trimester: Secondary | ICD-10-CM | POA: Insufficient documentation

## 2023-02-20 DIAGNOSIS — O219 Vomiting of pregnancy, unspecified: Secondary | ICD-10-CM | POA: Insufficient documentation

## 2023-02-20 DIAGNOSIS — A599 Trichomoniasis, unspecified: Secondary | ICD-10-CM | POA: Diagnosis not present

## 2023-02-20 DIAGNOSIS — O23591 Infection of other part of genital tract in pregnancy, first trimester: Secondary | ICD-10-CM | POA: Diagnosis not present

## 2023-02-20 NOTE — ED Triage Notes (Signed)
Pt reports n/v x 3d, denies diarrhea, reports [redacted] weeks pregnant

## 2023-02-21 ENCOUNTER — Emergency Department (HOSPITAL_BASED_OUTPATIENT_CLINIC_OR_DEPARTMENT_OTHER)
Admission: EM | Admit: 2023-02-21 | Discharge: 2023-02-21 | Disposition: A | Discharge status: Home / Self Care | Payer: Medicaid Other | Attending: Emergency Medicine | Admitting: Emergency Medicine

## 2023-02-21 DIAGNOSIS — A599 Trichomoniasis, unspecified: Secondary | ICD-10-CM

## 2023-02-21 DIAGNOSIS — N390 Urinary tract infection, site not specified: Secondary | ICD-10-CM

## 2023-02-21 DIAGNOSIS — O219 Vomiting of pregnancy, unspecified: Secondary | ICD-10-CM

## 2023-02-21 LAB — URINALYSIS, ROUTINE W REFLEX MICROSCOPIC
Glucose, UA: NEGATIVE mg/dL
Ketones, ur: 15 mg/dL — AB
Nitrite: NEGATIVE
Protein, ur: 100 mg/dL — AB
Specific Gravity, Urine: >=1.03 (ref 1.005–1.030)
pH: 5.5 (ref 5.0–8.0)

## 2023-02-21 LAB — COMPREHENSIVE METABOLIC PANEL
ALT: 8 U/L (ref 0–44)
AST: 14 U/L — ABNORMAL LOW (ref 15–41)
Albumin: 3.5 g/dL (ref 3.5–5.0)
Alkaline Phosphatase: 66 U/L (ref 38–126)
Anion gap: 8 (ref 5–15)
BUN: 9 mg/dL (ref 6–20)
CO2: 24 mmol/L (ref 22–32)
Calcium: 9 mg/dL (ref 8.9–10.3)
Chloride: 101 mmol/L (ref 98–111)
Creatinine, Ser: 0.74 mg/dL (ref 0.44–1.00)
GFR, Estimated: >60 mL/min (ref >60)
Glucose, Bld: 125 mg/dL — ABNORMAL HIGH (ref 70–99)
Potassium: 3.7 mmol/L (ref 3.5–5.1)
Sodium: 133 mmol/L — ABNORMAL LOW (ref 135–145)
Total Bilirubin: 0.3 mg/dL (ref 0.3–1.2)
Total Protein: 8.5 g/dL — ABNORMAL HIGH (ref 6.5–8.1)

## 2023-02-21 LAB — LIPASE, BLOOD: Lipase: 26 U/L (ref 11–51)

## 2023-02-21 LAB — URINALYSIS, MICROSCOPIC (REFLEX): WBC, UA: >50 WBC/hpf (ref 0–5)

## 2023-02-21 LAB — CBC
HCT: 36.9 % (ref 36.0–46.0)
Hemoglobin: 12.2 g/dL (ref 12.0–15.0)
MCH: 28.8 pg (ref 26.0–34.0)
MCHC: 33.1 g/dL (ref 30.0–36.0)
MCV: 87.2 fL (ref 80.0–100.0)
Platelets: 289 10*3/uL (ref 150–400)
RBC: 4.23 MIL/uL (ref 3.87–5.11)
RDW: 13.2 % (ref 11.5–15.5)
WBC: 12.1 10*3/uL — ABNORMAL HIGH (ref 4.0–10.5)
nRBC: 0 % (ref 0.0–0.2)

## 2023-02-21 LAB — PREGNANCY, URINE: Preg Test, Ur: POSITIVE — AB

## 2023-02-21 MED ORDER — SODIUM CHLORIDE 0.9 % IV SOLN
1.0000 g | Freq: Once | INTRAVENOUS | Status: AC
  Administered 2023-02-21: 1 g via INTRAVENOUS
  Filled 2023-02-21: qty 10

## 2023-02-21 MED ORDER — METOCLOPRAMIDE HCL 10 MG PO TABS: 10.0000 mg | ORAL_TABLET | Freq: Four times a day (QID) | ORAL | 0 refills | Status: AC | PRN

## 2023-02-21 MED ORDER — METRONIDAZOLE 500 MG PO TABS: 500.0000 mg | ORAL_TABLET | Freq: Two times a day (BID) | ORAL | 0 refills | Status: AC

## 2023-02-21 MED ORDER — CEPHALEXIN 500 MG PO CAPS: 500.0000 mg | ORAL_CAPSULE | Freq: Two times a day (BID) | ORAL | 0 refills | Status: AC

## 2023-02-21 MED ORDER — METOCLOPRAMIDE HCL 5 MG/ML IJ SOLN
10.0000 mg | Freq: Once | INTRAMUSCULAR | Status: AC
  Administered 2023-02-21: 10 mg via INTRAVENOUS
  Filled 2023-02-21: qty 2

## 2023-02-21 MED ORDER — METRONIDAZOLE 500 MG PO TABS: 500.0000 mg | ORAL_TABLET | Freq: Two times a day (BID) | ORAL | 0 refills | Status: DC

## 2023-02-21 MED ORDER — SODIUM CHLORIDE 0.9 % IV BOLUS
1000.0000 mL | Freq: Once | INTRAVENOUS | Status: AC
Start: 2023-02-21 — End: 2023-02-21
  Administered 2023-02-21: 1000 mL via INTRAVENOUS

## 2023-02-21 NOTE — ED Notes (Signed)
Pt is approx [redacted] weeks pregnant, no c/o pain only vomiting which she has encountered this pregnancy

## 2023-02-21 NOTE — ED Provider Notes (Signed)
[facility] Provider Note: Jamie Dell, MD, FACEP  CSN: [id number] MRN: [id number] ARRIVAL: 02/20/23 at 2329 ROOM: [facility]08/[facility]   CHIEF COMPLAINT  Vomiting   HISTORY OF PRESENT ILLNESS  02/21/23 2:16 AM Jamie Price is a 27 y.o. female who is about [redacted] weeks pregnant.  She is here with nausea and vomiting for the past 3 days.  She denies diarrhea.  She is having no abdominal pain.  She is hyper salivating.  She denies vaginal bleeding or discharge.   Past Medical History:  Diagnosis Date   Asthma     Past Surgical History:  Procedure Laterality Date   RETAINED PLACENTA REMOVAL      History reviewed. No pertinent family history.  Social History   Tobacco Use   Smoking status: Never   Smokeless tobacco: Never  Vaping Use   Vaping Use: Never used  Substance Use Topics   Alcohol use: No   Drug use: No    Prior to Admission medications   Medication Sig Start Date End Date Taking? Authorizing Provider  cephALEXin (KEFLEX) 500 MG capsule Take 1 capsule (500 mg total) by mouth 2 (two) times daily for 5 days. 02/21/23 02/26/23 Yes TRUE Garciamartinez, Jonny Ruiz, MD  metoCLOPramide (REGLAN) 10 MG tablet Take 1 tablet (10 mg total) by mouth every 6 (six) hours as needed for nausea or vomiting. 02/21/23  Yes Cainen Burnham, MD  metroNIDAZOLE (FLAGYL) 500 MG tablet Take 1 tablet (500 mg total) by mouth 2 (two) times daily. One po bid x 7 days 02/21/23   Judieth Mckown, Jonny Ruiz, MD    Allergies Patient has no known allergies.   REVIEW OF SYSTEMS  Negative except as noted here or in the History of Present Illness.   PHYSICAL EXAMINATION  Initial Vital Signs Blood pressure (!) 136/106, pulse (!) 121, temperature 99.3 F (37.4 C), resp. rate 20, height 5\' 3"  (1.6 m), weight (!) 161.5 kg, last menstrual period 11/26/2022, SpO2 98 %, unknown if currently breastfeeding.  Examination General: Well-developed, well-nourished female in no acute distress; appearance consistent with age of  record HENT: normocephalic; atraumatic Eyes: Normal appearance Neck: supple Heart: regular rate and rhythm; tachycardia Lungs: clear to auscultation bilaterally Abdomen: soft; nondistended; nontender; bowel sounds present Extremities: No deformity; full range of motion Neurologic: Awake, alert and oriented; motor function intact in all extremities and symmetric; no facial droop Skin: Warm and dry Psychiatric: Normal mood and affect   RESULTS  Summary of this visit's results, reviewed and interpreted by myself:   EKG Interpretation  Date/Time:    Ventricular Rate:    PR Interval:    QRS Duration:   QT Interval:    QTC Calculation:   R Axis:     Text Interpretation:         Laboratory Studies: Results for orders placed or performed during the hospital encounter of 02/21/23 (from the past 24 hour(s))  Lipase, blood     Status: None   Collection Time: 02/20/23 11:47 PM  Result Value Ref Range   Lipase 26 11 - 51 U/L  Comprehensive metabolic panel     Status: Abnormal   Collection Time: 02/20/23 11:47 PM  Result Value Ref Range   Sodium 133 (L) 135 - 145 mmol/L   Potassium 3.7 3.5 - 5.1 mmol/L   Chloride 101 98 - 111 mmol/L   CO2 24 22 - 32 mmol/L   Glucose, Bld 125 (H) 70 - 99 mg/dL   BUN 9 6 - 20 mg/dL  Creatinine, Ser 0.74 0.44 - 1.00 mg/dL   Calcium 9.0 8.9 - 16.1 mg/dL   Total Protein 8.5 (H) 6.5 - 8.1 g/dL   Albumin 3.5 3.5 - 5.0 g/dL   AST 14 (L) 15 - 41 U/L   ALT 8 0 - 44 U/L   Alkaline Phosphatase 66 38 - 126 U/L   Total Bilirubin 0.3 0.3 - 1.2 mg/dL   GFR, Estimated >09 >60 mL/min   Anion gap 8 5 - 15  CBC     Status: Abnormal   Collection Time: 02/20/23 11:47 PM  Result Value Ref Range   WBC 12.1 (H) 4.0 - 10.5 K/uL   RBC 4.23 3.87 - 5.11 MIL/uL   Hemoglobin 12.2 12.0 - 15.0 g/dL   HCT 45.4 09.8 - 11.9 %   MCV 87.2 80.0 - 100.0 fL   MCH 28.8 26.0 - 34.0 pg   MCHC 33.1 30.0 - 36.0 g/dL   RDW 14.7 82.9 - 56.2 %   Platelets 289 150 - 400 K/uL    nRBC 0.0 0.0 - 0.2 %  Urinalysis, Routine w reflex microscopic -Urine, Clean Catch     Status: Abnormal   Collection Time: 02/21/23  2:05 AM  Result Value Ref Range   Color, Urine YELLOW YELLOW   APPearance CLOUDY (A) CLEAR   Specific Gravity, Urine >=1.030 1.005 - 1.030   pH 5.5 5.0 - 8.0   Glucose, UA NEGATIVE NEGATIVE mg/dL   Hgb urine dipstick TRACE (A) NEGATIVE   Bilirubin Urine SMALL (A) NEGATIVE   Ketones, ur 15 (A) NEGATIVE mg/dL   Protein, ur 130 (A) NEGATIVE mg/dL   Nitrite NEGATIVE NEGATIVE   Leukocytes,Ua LARGE (A) NEGATIVE  Pregnancy, urine     Status: Abnormal   Collection Time: 02/21/23  2:05 AM  Result Value Ref Range   Preg Test, Ur POSITIVE (A) NEGATIVE  Urinalysis, Microscopic (reflex)     Status: Abnormal   Collection Time: 02/21/23  2:05 AM  Result Value Ref Range   RBC / HPF 0-5 0 - 5 RBC/hpf   WBC, UA >50 0 - 5 WBC/hpf   Bacteria, UA MANY (A) NONE SEEN   Squamous Epithelial / HPF 11-20 0 - 5 /HPF   Mucus PRESENT    Trichomonas, UA PRESENT (A) NONE SEEN   Urine-Other LESS THAN 10 mL OF URINE SUBMITTED    Imaging Studies: No results found.  ED COURSE and MDM  Nursing notes, initial and subsequent vitals signs, including pulse oximetry, reviewed and interpreted by myself.  Vitals:   02/21/23 0233 02/21/23 0300 02/21/23 0313 02/21/23 0313  BP: 125/82 121/78  121/78  Pulse: 91 91  91  Resp: 20   20  Temp:   99.1 F (37.3 C)   TempSrc:   Oral   SpO2: 100% 100%  100%  Weight:      Height:       Medications  cefTRIAXone (ROCEPHIN) 1 g in sodium chloride 0.9 % 100 mL IVPB (has no administration in time range)  sodium chloride 0.9 % bolus 1,000 mL (1,000 mLs Intravenous New Bag/Given 02/21/23 0233)  metoCLOPramide (REGLAN) injection 10 mg (10 mg Intravenous Given 02/21/23 0234)   3:21 AM Patient's urinalysis is consistent with a urinary tract infection she was given 1 g of Rocephin IV.  We will treat her with Keflex for 5 days.  Will also treat her for  trichomoniasis as she has trichomonas in her urine.  She was advised that her sexual partners need  to be treated as well.  It is unclear if her nausea and vomiting or just morning sickness or were exacerbated by her urinary tract infection.   PROCEDURES  Procedures   ED DIAGNOSES     ICD-10-CM   1. Nausea and vomiting during pregnancy  O21.9     2. Lower urinary tract infection  N39.0     3. Trichomoniasis  A59.9          Kendle Erker, MD 02/21/23 [phone]

## 2023-02-22 LAB — URINE CULTURE

## 2024-10-21 ENCOUNTER — Emergency Department (HOSPITAL_BASED_OUTPATIENT_CLINIC_OR_DEPARTMENT_OTHER)
Admission: EM | Admit: 2024-10-21 | Discharge: 2024-10-21 | Disposition: A | Discharge status: Home / Self Care | Attending: Emergency Medicine | Admitting: Emergency Medicine

## 2024-10-21 ENCOUNTER — Other Ambulatory Visit: Payer: Self-pay

## 2024-10-21 DIAGNOSIS — R059 Cough, unspecified: Secondary | ICD-10-CM | POA: Diagnosis present

## 2024-10-21 DIAGNOSIS — J101 Influenza due to other identified influenza virus with other respiratory manifestations: Secondary | ICD-10-CM | POA: Diagnosis not present

## 2024-10-21 LAB — RESP PANEL BY RT-PCR (RSV, FLU A&B, COVID)  RVPGX2
Influenza A by PCR: POSITIVE — AB
Influenza B by PCR: NEGATIVE
Resp Syncytial Virus by PCR: NEGATIVE
SARS Coronavirus 2 by RT PCR: NEGATIVE

## 2024-10-21 LAB — GROUP A STREP BY PCR: Group A Strep by PCR: NOT DETECTED

## 2024-10-21 NOTE — Discharge Instructions (Signed)
 You were seen for your upper respiratory tract infection in the emergency department.   At home, please use Tylenol and ibuprofen for your muscle aches and fevers.  Please use over-the-counter cough medication or tea with honey for your cough.  Follow-up with your primary doctor in 2-3 days regarding your visit.  This may be over the phone.  Return immediately to the emergency department if you experience any of the following: Difficulty breathing, or any other concerning symptoms.    Thank you for visiting our Emergency Department. It was a pleasure taking care of you today.

## 2024-10-21 NOTE — ED Triage Notes (Signed)
 Patient here POV from Home with family.  Notes 2-3 days headaches, fatigue, sore throat, intermittent cough, congestion.  NAD noted during triage. A&Ox4. Gcs 15. Ambulatory.

## 2024-10-21 NOTE — ED Provider Notes (Signed)
 " [facility] Provider Note   CSN: [id number] Arrival date & time: 10/21/24  1305     Patient presents with: Cough   Jamie Price is a 29 y.o. female.   29 year old female previously healthy presents emergency department with URI type symptoms for 3 to 4 days.  Congestion, cough, subjective fevers and chills.  2 children are sick with similar symptoms.       Prior to Admission medications  Medication Sig Start Date End Date Taking? Authorizing Provider  metoCLOPramide  (REGLAN ) 10 MG tablet Take 1 tablet (10 mg total) by mouth every 6 (six) hours as needed for nausea or vomiting. 02/21/23   Molpus, Norleen, MD  metroNIDAZOLE  (FLAGYL ) 500 MG tablet Take 1 tablet (500 mg total) by mouth 2 (two) times daily. One po bid x 7 days 02/21/23   Molpus, Norleen, MD    Allergies: Patient has no known allergies.    Review of Systems  Updated Vital Signs BP (!) 148/102   Pulse (!) 108   Temp 98.1 F (36.7 C) (Oral)   Resp 18   Ht 5' 3 (1.6 m)   Wt (!) 149.7 kg   LMP 10/19/2024   SpO2 99%   BMI 58.46 kg/m   Physical Exam Vitals and nursing note reviewed.  Constitutional:      General: She is not in acute distress.    Appearance: She is well-developed.  HENT:     Head: Normocephalic and atraumatic.     Right Ear: External ear normal.     Left Ear: External ear normal.     Nose: Congestion present.  Eyes:     Extraocular Movements: Extraocular movements intact.     Conjunctiva/sclera: Conjunctivae normal.     Pupils: Pupils are equal, round, and reactive to light.  Cardiovascular:     Rate and Rhythm: Normal rate and regular rhythm.     Heart sounds: No murmur heard. Pulmonary:     Effort: Pulmonary effort is normal. No respiratory distress.     Breath sounds: Normal breath sounds.  Musculoskeletal:     Cervical back: Normal range of motion and neck supple.  Skin:    General: Skin is warm and dry.  Neurological:     Mental  Status: She is alert and oriented to person, place, and time. Mental status is at baseline.  Psychiatric:        Mood and Affect: Mood normal.     (all labs ordered are listed, but only abnormal results are displayed) Labs Reviewed  RESP PANEL BY RT-PCR (RSV, FLU A&B, COVID)  RVPGX2 - Abnormal; Notable for the following components:      Result Value   Influenza A by PCR POSITIVE (*)    All other components within normal limits  GROUP A STREP BY PCR    EKG: None  Radiology: No results found.   Procedures   Medications Ordered in the ED - No data to display                                  Medical Decision Making  Jamie Price is a 29 y.o. female who presents with URI symptoms  Initial Ddx:  URI, sinusitis, pneumonia   MDM:  Feel the patient likely has a URI based on their symptoms.  They are overall well-appearing and did not have any rhonchi or wheezing to suggest pneumonia so  do not feel that chest x-ray is warranted.  Feel sinusitis is less likely.  Plan:  COVID/flu  ED Summary/Re-evaluation:  Patient reevaluated in the emergency department and was stable.  Was found to have influenza.  No indication for Tamiflu.  Will have her call her PCP in several days to make sure she is recovering as expected  This patient presents to the ED for concern of complaints listed in HPI, this involves an extensive number of treatment options, and is a complaint that carries with it a high risk of complications and morbidity. Disposition including potential need for admission considered.   Dispo: DC Home. Return precautions discussed including, but not limited to, those listed in the AVS. Allowed pt time to ask questions which were answered fully prior to dc.  Records reviewed Outpatient Clinic Notes I have reviewed the patients home medications and made adjustments as needed  Portions of this note were generated with Dragon dictation software. Dictation errors may occur  despite best attempts at proofreading.     Final diagnoses:  Influenza A    ED Discharge Orders     None          Yolande Lamar BROCKS, MD 10/22/24 1120  "
